# Patient Record
Sex: Female | Born: 1945 | Race: White | Hispanic: No | Marital: Single
Health system: Southern US, Community
[De-identification: ages and names within clinical notes are randomized; demographics above are authoritative.]

## PROBLEM LIST (undated history)

## (undated) DIAGNOSIS — Z8619 Personal history of other infectious and parasitic diseases: Secondary | ICD-10-CM

## (undated) HISTORY — PX: ABDOMINAL HYSTERECTOMY: SHX81

## (undated) HISTORY — PX: OTHER SURGICAL HISTORY: SHX169

---

## 2015-12-15 ENCOUNTER — Encounter (HOSPITAL_COMMUNITY): Payer: Self-pay

## 2015-12-15 ENCOUNTER — Emergency Department (HOSPITAL_COMMUNITY): Payer: No Typology Code available for payment source

## 2015-12-15 ENCOUNTER — Emergency Department (HOSPITAL_COMMUNITY)
Admission: EM | Admit: 2015-12-15 | Discharge: 2015-12-16 | Disposition: A | Discharge status: Home / Self Care | Payer: No Typology Code available for payment source | Attending: Emergency Medicine | Admitting: Emergency Medicine

## 2015-12-15 DIAGNOSIS — Y929 Unspecified place or not applicable: Secondary | ICD-10-CM | POA: Insufficient documentation

## 2015-12-15 DIAGNOSIS — R918 Other nonspecific abnormal finding of lung field: Secondary | ICD-10-CM | POA: Diagnosis not present

## 2015-12-15 DIAGNOSIS — S2220XA Unspecified fracture of sternum, initial encounter for closed fracture: Secondary | ICD-10-CM | POA: Diagnosis not present

## 2015-12-15 DIAGNOSIS — Y999 Unspecified external cause status: Secondary | ICD-10-CM | POA: Insufficient documentation

## 2015-12-15 DIAGNOSIS — S29001A Unspecified injury of muscle and tendon of front wall of thorax, initial encounter: Secondary | ICD-10-CM | POA: Diagnosis present

## 2015-12-15 DIAGNOSIS — Y9389 Activity, other specified: Secondary | ICD-10-CM | POA: Diagnosis not present

## 2015-12-15 HISTORY — DX: Personal history of other infectious and parasitic diseases: Z86.19

## 2015-12-15 LAB — CBC WITH DIFFERENTIAL/PLATELET
Basophils Absolute: 0 10*3/uL (ref 0.0–0.1)
Basophils Relative: 0 %
Eosinophils Absolute: 0 10*3/uL (ref 0.0–0.7)
Eosinophils Relative: 0 %
HEMATOCRIT: 42.7 % (ref 36.0–46.0)
HEMOGLOBIN: 14.6 g/dL (ref 12.0–15.0)
LYMPHS ABS: 2.4 10*3/uL (ref 0.7–4.0)
Lymphocytes Relative: 26 %
MCH: 32.9 pg (ref 26.0–34.0)
MCHC: 34.2 g/dL (ref 30.0–36.0)
MCV: 96.2 fL (ref 78.0–100.0)
MONO ABS: 0.8 10*3/uL (ref 0.1–1.0)
MONOS PCT: 9 %
NEUTROS ABS: 5.8 10*3/uL (ref 1.7–7.7)
NEUTROS PCT: 65 %
Platelets: 231 10*3/uL (ref 150–400)
RBC: 4.44 MIL/uL (ref 3.87–5.11)
RDW: 14.1 % (ref 11.5–15.5)
WBC: 9 10*3/uL (ref 4.0–10.5)

## 2015-12-15 LAB — COMPREHENSIVE METABOLIC PANEL
ALK PHOS: 69 U/L (ref 38–126)
ALT: 24 U/L (ref 14–54)
ANION GAP: 10 (ref 5–15)
AST: 26 U/L (ref 15–41)
Albumin: 4.4 g/dL (ref 3.5–5.0)
BILIRUBIN TOTAL: 0.9 mg/dL (ref 0.3–1.2)
BUN: 16 mg/dL (ref 6–20)
CALCIUM: 8.9 mg/dL (ref 8.9–10.3)
CO2: 24 mmol/L (ref 22–32)
Chloride: 101 mmol/L (ref 101–111)
Creatinine, Ser: 0.6 mg/dL (ref 0.44–1.00)
Glucose, Bld: 101 mg/dL — ABNORMAL HIGH (ref 65–99)
Potassium: 3.8 mmol/L (ref 3.5–5.1)
Sodium: 135 mmol/L (ref 135–145)
TOTAL PROTEIN: 7.6 g/dL (ref 6.5–8.1)

## 2015-12-15 MED ORDER — ONDANSETRON HCL 4 MG/2ML IJ SOLN
4.0000 mg | Freq: Once | INTRAMUSCULAR | Status: AC
  Administered 2015-12-15: 4 mg via INTRAVENOUS
  Filled 2015-12-15: qty 2

## 2015-12-15 MED ORDER — HYDROMORPHONE HCL 1 MG/ML IJ SOLN
1.0000 mg | Freq: Once | INTRAMUSCULAR | Status: AC
  Administered 2015-12-15: 1 mg via INTRAVENOUS
  Filled 2015-12-15: qty 1

## 2015-12-15 MED ORDER — OXYCODONE-ACETAMINOPHEN 5-325 MG PO TABS: 1.0000 | ORAL_TABLET | Freq: Four times a day (QID) | ORAL | Status: AC | PRN

## 2015-12-15 MED ORDER — HYDROMORPHONE HCL 1 MG/ML IJ SOLN
0.5000 mg | Freq: Once | INTRAMUSCULAR | Status: AC
  Administered 2015-12-15: 0.5 mg via INTRAVENOUS
  Filled 2015-12-15: qty 1

## 2015-12-15 MED ORDER — IOPAMIDOL (ISOVUE-300) INJECTION 61%
100.0000 mL | Freq: Once | INTRAVENOUS | Status: AC | PRN
  Administered 2015-12-15: 100 mL via INTRAVENOUS
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
## 2015-12-15 NOTE — ED Notes (Signed)
Patient was given a prepackage of six Percocet and given instructions on use, patient verbally understands.

## 2015-12-15 NOTE — Discharge Instructions (Signed)
Follow up next week for recheck °
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
## 2015-12-20 MED FILL — Oxycodone w/ Acetaminophen Tab 5-325 MG: ORAL | Qty: 6 | Status: AC
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
## 2022-02-01 DEATH — deceased
